# Patient Record
Sex: Female | Born: 1960 | Race: White | Hispanic: No | Smoking: Never smoker
Health system: Southern US, Community
[De-identification: ages and names within clinical notes are randomized; demographics above are authoritative.]

## PROBLEM LIST (undated history)

## (undated) DIAGNOSIS — E785 Hyperlipidemia, unspecified: Secondary | ICD-10-CM

## (undated) HISTORY — PX: CARPAL TUNNEL RELEASE: SHX101

## (undated) HISTORY — PX: TOTAL HIP ARTHROPLASTY: SHX124

## (undated) HISTORY — DX: Hyperlipidemia, unspecified: E78.5

## (undated) HISTORY — PX: REPLACEMENT TOTAL KNEE: SUR1224

---

## 2010-08-03 ENCOUNTER — Ambulatory Visit: Payer: Self-pay | Admitting: Internal Medicine

## 2011-08-04 ENCOUNTER — Ambulatory Visit: Payer: Self-pay | Admitting: Internal Medicine

## 2012-08-08 ENCOUNTER — Ambulatory Visit: Payer: Self-pay | Admitting: Internal Medicine

## 2013-08-01 ENCOUNTER — Ambulatory Visit: Payer: Self-pay | Admitting: Internal Medicine

## 2014-08-12 ENCOUNTER — Ambulatory Visit: Payer: Self-pay | Admitting: Internal Medicine

## 2015-08-20 ENCOUNTER — Other Ambulatory Visit: Payer: Self-pay | Admitting: Internal Medicine

## 2015-08-20 DIAGNOSIS — Z1231 Encounter for screening mammogram for malignant neoplasm of breast: Secondary | ICD-10-CM

## 2015-08-25 ENCOUNTER — Ambulatory Visit
Admission: RE | Admit: 2015-08-25 | Discharge: 2015-08-25 | Disposition: A | Discharge status: Home / Self Care | Payer: BLUE CROSS/BLUE SHIELD | Source: Ambulatory Visit | Attending: Internal Medicine | Admitting: Internal Medicine

## 2015-08-25 DIAGNOSIS — Z1231 Encounter for screening mammogram for malignant neoplasm of breast: Secondary | ICD-10-CM | POA: Diagnosis not present

## 2016-08-16 ENCOUNTER — Other Ambulatory Visit: Payer: Self-pay | Admitting: Internal Medicine

## 2016-08-16 DIAGNOSIS — Z1231 Encounter for screening mammogram for malignant neoplasm of breast: Secondary | ICD-10-CM

## 2016-09-01 ENCOUNTER — Ambulatory Visit
Admission: RE | Admit: 2016-09-01 | Discharge: 2016-09-01 | Disposition: A | Discharge status: Home / Self Care | Payer: BLUE CROSS/BLUE SHIELD | Source: Ambulatory Visit | Attending: Internal Medicine | Admitting: Internal Medicine

## 2016-09-01 DIAGNOSIS — Z1231 Encounter for screening mammogram for malignant neoplasm of breast: Secondary | ICD-10-CM | POA: Insufficient documentation

## 2016-09-09 ENCOUNTER — Other Ambulatory Visit: Payer: Self-pay | Admitting: Internal Medicine

## 2016-09-09 DIAGNOSIS — N6489 Other specified disorders of breast: Secondary | ICD-10-CM

## 2018-03-21 ENCOUNTER — Other Ambulatory Visit: Payer: Self-pay | Admitting: Sports Medicine

## 2018-03-21 ENCOUNTER — Encounter: Payer: Self-pay | Admitting: Sports Medicine

## 2018-03-21 ENCOUNTER — Telehealth: Payer: Self-pay | Admitting: *Deleted

## 2018-03-21 ENCOUNTER — Ambulatory Visit: Payer: BLUE CROSS/BLUE SHIELD | Admitting: Sports Medicine

## 2018-03-21 ENCOUNTER — Ambulatory Visit (INDEPENDENT_AMBULATORY_CARE_PROVIDER_SITE_OTHER): Payer: BLUE CROSS/BLUE SHIELD

## 2018-03-21 DIAGNOSIS — S96911A Strain of unspecified muscle and tendon at ankle and foot level, right foot, initial encounter: Secondary | ICD-10-CM

## 2018-03-21 DIAGNOSIS — S99911A Unspecified injury of right ankle, initial encounter: Secondary | ICD-10-CM

## 2018-03-21 DIAGNOSIS — M792 Neuralgia and neuritis, unspecified: Secondary | ICD-10-CM

## 2018-03-21 DIAGNOSIS — S93401A Sprain of unspecified ligament of right ankle, initial encounter: Secondary | ICD-10-CM

## 2018-03-21 NOTE — Telephone Encounter (Signed)
-----   Message from [place], [place] sent at 03/21/2018 11:23 AM EDT ----- Regarding: MRI R ankle Eval peroneal tendon possible tear with pain along tendon and  tingling along sural nerve course after misstep from curve 2 weeks ago

## 2018-03-21 NOTE — Progress Notes (Signed)
Subjective: Kelly Snow is a 57 y.o. female patient who presents to office for evaluation of right foot pain. Patient complains of progressive pain especially over the last 2 weeks that the side of the right foot and ankle. Ranks pain 5/10 and is now interferring with daily activities especially with certain motions of turning foot side to side can feel a burning and stretching pain that radiates on the lateral side of her foot and ankle.  States that this started after she took a misstep off a curb 2 weeks ago. Patient has tried rest, ice, elevation and taking Tylenol which seems like it has helped some however still concerned because pain is still present after 2 weeks. Patient denies any other pedal complaints.   Review of Systems  Musculoskeletal: Positive for joint pain.  All other systems reviewed and are negative.    There are no active problems to display for this patient.   No current outpatient medications on file prior to visit.   No current facility-administered medications on file prior to visit.     Not on File  Objective:  General: Alert and oriented x3 in no acute distress  Dermatology: No open lesions bilateral lower extremities, no webspace macerations, no ecchymosis bilateral, all nails x 10 are well manicured.  Vascular: Dorsalis Pedis and Posterior Tibial pedal pulses palpable, Capillary Fill Time 3 seconds,(+) pedal hair growth bilateral, very minimal swelling at right lateral foot and ankle, Temperature gradient within normal limits.  Neurology: Gross sensation intact via light touch bilateral, negative Tinel sign on the right.  Subjective burning sensation with extreme range of motion especially with inversion on the right lateral ankle over the sural nerve.  Musculoskeletal: Mild tenderness with palpation peroneal tendon course on the right, no frank instability noted however there is pain with end range inversion on the right lateral foot and ankle with a  pulling and burning sensation.    Gait: Mildly antalgic gait  Xrays  Right foot   Impression: Diffuse arthritis, no obvious fracture, mild soft tissue swelling, no other acute findings.  Assessment and Plan: Problem List Items Addressed This Visit    None    Visit Diagnoses    Ankle injury, right, initial encounter    -  Primary   Neuritis       Tear of tendon of right foot, initial encounter       peroneal   Mild ankle sprain, right, initial encounter          -Complete examination performed -Xrays reviewed -Discussed treatement options for possible partial tendon tear of peroneal tendons versus ankle sprain with neuritis -Patient declined cam boot and declined soft cast -Dispensed ankle gauntlet to use soft protection to right foot and ankle meanwhile -Prescribed MRI for further evaluation of ligaments and tendons at the lateral ankle to rule out possible tear -Recommend rest, ice, compression with compression garments that she already has an elevation until symptoms improve -May continue with Tylenol as needed for pain and patient is currently on meloxicam for arthritis that she has in other areas -Patient to return to office after MRI or sooner if condition worsens.  Asencion Islam, DPM

## 2018-03-28 NOTE — Telephone Encounter (Signed)
I informed pt of MRI appt and she stated she may not be able to make the time, I gave pt [facility] [phone] to schedule.

## 2018-03-28 NOTE — Telephone Encounter (Signed)
[facility] scheduled pt for MRI [id number] right on 03/30/2018 arrive 9:30am for 9:45am. [facility].

## 2023-10-19 ENCOUNTER — Other Ambulatory Visit: Payer: Self-pay | Admitting: Pharmacist

## 2023-10-19 ENCOUNTER — Other Ambulatory Visit: Payer: Self-pay | Admitting: Urgent Care

## 2023-10-19 DIAGNOSIS — R5381 Other malaise: Secondary | ICD-10-CM

## 2023-10-19 DIAGNOSIS — Z136 Encounter for screening for cardiovascular disorders: Secondary | ICD-10-CM

## 2023-10-20 ENCOUNTER — Other Ambulatory Visit: Payer: Self-pay | Admitting: Urgent Care

## 2023-10-20 DIAGNOSIS — R5381 Other malaise: Secondary | ICD-10-CM

## 2023-10-24 ENCOUNTER — Other Ambulatory Visit: Payer: Self-pay | Admitting: Urgent Care

## 2023-10-24 DIAGNOSIS — Z13820 Encounter for screening for osteoporosis: Secondary | ICD-10-CM

## 2023-11-28 ENCOUNTER — Ambulatory Visit
Admission: RE | Admit: 2023-11-28 | Discharge: 2023-11-28 | Disposition: A | Discharge status: Home / Self Care | Payer: No Typology Code available for payment source | Source: Ambulatory Visit | Attending: Urgent Care | Admitting: Urgent Care

## 2023-11-28 DIAGNOSIS — Z136 Encounter for screening for cardiovascular disorders: Secondary | ICD-10-CM

## 2024-01-31 ENCOUNTER — Other Ambulatory Visit: Payer: Self-pay | Admitting: Urgent Care

## 2024-01-31 ENCOUNTER — Ambulatory Visit
Admission: RE | Admit: 2024-01-31 | Discharge: 2024-01-31 | Disposition: A | Discharge status: Home / Self Care | Payer: PRIVATE HEALTH INSURANCE | Source: Ambulatory Visit | Attending: Urgent Care | Admitting: Urgent Care

## 2024-01-31 DIAGNOSIS — M25511 Pain in right shoulder: Secondary | ICD-10-CM

## 2024-05-24 LAB — LAB REPORT - SCANNED
A1c: 6
Creatinine, POC: 35 mg/dL
EGFR: 101

## 2024-06-04 ENCOUNTER — Other Ambulatory Visit: Payer: Self-pay

## 2024-06-04 DIAGNOSIS — E785 Hyperlipidemia, unspecified: Secondary | ICD-10-CM | POA: Insufficient documentation

## 2024-06-05 ENCOUNTER — Encounter: Payer: Self-pay | Admitting: Cardiology

## 2024-06-05 ENCOUNTER — Ambulatory Visit: Payer: PRIVATE HEALTH INSURANCE | Attending: Cardiology | Admitting: Cardiology

## 2024-06-05 VITALS — BP 134/82 | HR 62 | Ht 65.0 in | Wt 270.2 lb

## 2024-06-05 DIAGNOSIS — R011 Cardiac murmur, unspecified: Secondary | ICD-10-CM

## 2024-06-05 DIAGNOSIS — I2510 Atherosclerotic heart disease of native coronary artery without angina pectoris: Secondary | ICD-10-CM | POA: Insufficient documentation

## 2024-06-05 DIAGNOSIS — E785 Hyperlipidemia, unspecified: Secondary | ICD-10-CM | POA: Diagnosis not present

## 2024-06-05 DIAGNOSIS — I10 Essential (primary) hypertension: Secondary | ICD-10-CM

## 2024-06-05 DIAGNOSIS — R0609 Other forms of dyspnea: Secondary | ICD-10-CM

## 2024-06-05 HISTORY — DX: Cardiac murmur, unspecified: R01.1

## 2024-06-05 HISTORY — DX: Essential (primary) hypertension: I10

## 2024-06-05 HISTORY — DX: Atherosclerotic heart disease of native coronary artery without angina pectoris: I25.10

## 2024-06-05 HISTORY — DX: Other forms of dyspnea: R06.09

## 2024-06-05 HISTORY — DX: Morbid (severe) obesity due to excess calories: E66.01

## 2024-06-05 MED ORDER — METOPROLOL TARTRATE 25 MG PO TABS: ORAL_TABLET | ORAL | 0 refills | Status: DC

## 2024-06-05 MED ORDER — ASPIRIN 81 MG PO TBEC: 81.0000 mg | DELAYED_RELEASE_TABLET | Freq: Every day | ORAL | Status: DC

## 2024-06-05 MED ORDER — METOPROLOL TARTRATE 50 MG PO TABS: ORAL_TABLET | ORAL | 0 refills | Status: DC

## 2024-06-05 NOTE — Patient Instructions (Addendum)
 Medication Instructions:  Your physician has recommended you make the following change in your medication:   Start taking 81 mg coated aspirin  daily  *If you need a refill on your cardiac medications before your next appointment, please call your pharmacy*   Lab Work: Your physician recommends that you return for lab work in: 6 weeks for lft's and fasting lipids You need to have labs done when you are fasting.  You can come Monday through Friday 8:30 am to 12:00 pm and 1:15 to 4:30. You do not need to make an appointment as the order has already been placed. The labs you are going to have done are BMET, CBC, TSH, LFT and Lipids.  If you have labs (blood work) drawn today and your tests are completely normal, you will receive your results only by: MyChart Message (if you have MyChart) OR A paper copy in the mail If you have any lab test that is abnormal or we need to change your treatment, we will call you to review the results.  Testing/Procedures: Your physician has requested that you have an echocardiogram. Echocardiography is a painless test that uses sound waves to create images of your heart. It provides your doctor with information about the size and shape of your heart and how well your heart's chambers and valves are working. This procedure takes approximately one hour. There are no restrictions for this procedure. Please do NOT wear cologne, perfume, aftershave, or lotions (deodorant is allowed). Please arrive 15 minutes prior to your appointment time.  Please note: We ask at that you not bring children with you during ultrasound (echo/ vascular) testing. Due to room size and safety concerns, children are not allowed in the ultrasound rooms during exams. Our front office staff cannot provide observation of children in our lobby area while testing is being conducted. An adult accompanying a patient to their appointment will only be allowed in the ultrasound room at the discretion of the  ultrasound technician under special circumstances. We apologize for any inconvenience.    Your cardiac CT will be scheduled at one of the below locations:   [facility] [address] [place], [place]  Please follow these instructions carefully (unless otherwise directed):  An IV will be required for this test and Nitroglycerin will be given.   On the Night Before the Test: Be sure to Drink plenty of water. Do not consume any caffeinated/decaffeinated beverages or chocolate 12 hours prior to your test. Do not take any antihistamines 12 hours prior to your test.  On the Day of the Test: Drink plenty of water until 1 hour prior to the test. Do not eat any food 1 hour prior to test. You may take your regular medications prior to the test.  Take metoprolol  (Lopressor ) 25 mg two hours prior to test. FEMALES- please wear underwire-free bra if available, avoid dresses & tight clothing      After the Test: Drink plenty of water. After receiving IV contrast, you may experience a mild flushed feeling. This is normal. On occasion, you may experience a mild rash up to 24 hours after the test. This is not dangerous. If this occurs, you can take Benadryl 25 mg, Zyrtec, Claritin, or Allegra and increase your fluid intake. (Patients taking Tikosyn should avoid Benadryl, and may take Zyrtec, Claritin, or Allegra) If you experience trouble breathing, this can be serious. If it is severe call 911 IMMEDIATELY. If it is mild, please call our office.  We will call to  schedule your test 2-4 weeks out understanding that some insurance companies will need an authorization prior to the service being performed.   For more information and frequently asked questions, please visit our website : [email or web address]  For non-scheduling related questions, please contact the cardiac imaging nurse navigator should you have any questions/concerns: Cardiac Imaging Nurse Navigators Direct Office  Dial: [phone]   For scheduling needs, including cancellations and rescheduling, please call [place], [phone].   Follow-Up: At [facility], you and your health needs are our priority.  As part of our continuing mission to provide you with exceptional heart care, we have created designated Provider Care Teams.  These Care Teams include your primary Cardiologist (physician) and Advanced Practice Providers (APPs -  Physician Assistants and Nurse Practitioners) who all work together to provide you with the care you need, when you need it.  We recommend signing up for the patient portal called MyChart.  Sign up information is provided on this After Visit Summary.  MyChart is used to connect with patients for Virtual Visits (Telemedicine).  Patients are able to view lab/test results, encounter notes, upcoming appointments, etc.  Non-urgent messages can be sent to your provider as well.   To learn more about what you can do with MyChart, go to [email or web address].    Your next appointment:   2 month(s)  The format for your next appointment:   In Person  Provider:   Jennifer Crape, MD   Other Instructions Echocardiogram An echocardiogram is a test that uses sound waves (ultrasound) to produce images of the heart. Images from an echocardiogram can provide important information about: Heart size and shape. The size and thickness and movement of your heart's walls. Heart muscle function and strength. Heart valve function or if you have stenosis. Stenosis is when the heart valves are too narrow. If blood is flowing backward through the heart valves (regurgitation). A tumor or infectious growth around the heart valves. Areas of heart muscle that are not working well because of poor blood flow or injury from a heart attack. Aneurysm detection. An aneurysm is a weak or damaged part of an artery wall. The wall bulges out from the normal force of blood pumping through the body. Tell  a health care provider about: Any allergies you have. All medicines you are taking, including vitamins, herbs, eye drops, creams, and over-the-counter medicines. Any blood disorders you have. Any surgeries you have had. Any medical conditions you have. Whether you are pregnant or may be pregnant. What are the risks? Generally, this is a safe test. However, problems may occur, including an allergic reaction to dye (contrast) that may be used during the test. What happens before the test? No specific preparation is needed. You may eat and drink normally. What happens during the test? You will take off your clothes from the waist up and put on a hospital gown. Electrodes or electrocardiogram (ECG)patches may be placed on your chest. The electrodes or patches are then connected to a device that monitors your heart rate and rhythm. You will lie down on a table for an ultrasound exam. A gel will be applied to your chest to help sound waves pass through your skin. A handheld device, called a transducer, will be pressed against your chest and moved over your heart. The transducer produces sound waves that travel to your heart and bounce back (or echo back) to the transducer. These sound waves will be captured in real-time and changed into images of your heart that  can be viewed on a video monitor. The images will be recorded on a computer and reviewed by your health care provider. You may be asked to change positions or hold your breath for a short time. This makes it easier to get different views or better views of your heart. In some cases, you may receive contrast through an IV in one of your veins. This can improve the quality of the pictures from your heart. The procedure may vary among health care providers and hospitals.   What can I expect after the test? You may return to your normal, everyday life, including diet, activities, and medicines, unless your health care provider tells you not to do  that. Follow these instructions at home: It is up to you to get the results of your test. Ask your health care provider, or the department that is doing the test, when your results will be ready. Keep all follow-up visits. This is important. Summary An echocardiogram is a test that uses sound waves (ultrasound) to produce images of the heart. Images from an echocardiogram can provide important information about the size and shape of your heart, heart muscle function, heart valve function, and other possible heart problems. You do not need to do anything to prepare before this test. You may eat and drink normally. After the echocardiogram is completed, you may return to your normal, everyday life, unless your health care provider tells you not to do that. This information is not intended to replace advice given to you by your health care provider. Make sure you discuss any questions you have with your health care provider. Document Revised: 06/02/2020 Document Reviewed: 06/02/2020 Elsevier Patient Education  2021 Elsevier Inc.   Important Information About Sugar

## 2024-06-05 NOTE — Progress Notes (Signed)
 Cardiology Office Note:    Date:  06/05/2024   ID:  Kelly Snow, DOB [birth year], MRN [id number]  PCP:  Thurmond Cathlyn LABOR., MD  Cardiologist:  Jennifer JONELLE Crape, MD   Referring MD: Waddell Rake, MD    ASSESSMENT:    1. Hyperlipidemia, unspecified hyperlipidemia type   2. Coronary artery disease involving native coronary artery of native heart without angina pectoris   3. Morbid obesity (HCC)   4. Essential hypertension   5. DOE (dyspnea on exertion)   6. Cardiac murmur    PLAN:    In order of problems listed above:  Dyspnea on exertion: Markedly elevated calcium score and coronary artery calcification: I discussed my findings with the patient at extensive length.  I would like to evaluate her for any ischemic substrate and I discussed CT coronary angiography with FFR and she is agreeable.  She was advised to take a coated baby aspirin  on a daily basis. Essential hypertension: Blood pressure stable and diet was emphasized. Mixed dyslipidemia: On lipid-lowering medications.  Lipids are markedly elevated her statin was stopped she will be back in a month for follow-up blood work.  Goal LDL less than 60.  Diet emphasized lifestyle modification urged. Morbid obesity: Weight reduction stressed risks of obesity explained and she promises to do better. Patient will be seen in follow-up appointment in 6 months or earlier if the patient has any concerns.    Medication Adjustments/Labs and Tests Ordered: Current medicines are reviewed at length with the patient today.  Concerns regarding medicines are outlined above.  Orders Placed This Encounter  Procedures   EKG 12-Lead   No orders of the defined types were placed in this encounter.    History of Present Illness:    Kelly Snow is a 63 y.o. female who is being seen today for the evaluation of dyspnea on exertion at the request of Waddell Rake, MD. patient is a pleasant 63 year old female.  She has past medical history of  recently diagnosed markedly elevated calcium score and coronary artery calcification, essential hypertension, mixed dyslipidemia and morbid obesity.  She leads a sedentary lifestyle.  She has dyspnea on exertion.  She denies any chest pain orthopnea or PND.  At the time of my evaluation, the patient is alert awake oriented and in no distress.  Past Medical History:  Diagnosis Date   Hyperlipidemia     Past Surgical History:  Procedure Laterality Date   CARPAL TUNNEL RELEASE     REPLACEMENT TOTAL KNEE Bilateral    TOTAL HIP ARTHROPLASTY Right     Current Medications: Current Meds  Medication Sig   amLODipine (NORVASC) 5 MG tablet Take 5 mg by mouth daily.   APPLE CIDER VINEGAR PO Take 2 each by mouth daily.   atenolol (TENORMIN) 25 MG tablet Take 25 mg by mouth daily.   Calcium Carb-Cholecalciferol (CALCIUM 600 + D PO) Take 2 capsules by mouth daily.   FIBER GUMMIES PO Take 3 each by mouth daily.   lisinopril (ZESTRIL) 40 MG tablet Take 40 mg by mouth every morning.   MAGNESIUM PO Take 1 tablet by mouth daily.   meloxicam (MOBIC) 7.5 MG tablet Take 7.5 mg by mouth daily.   Multiple Vitamin (MULTIVITAMIN) tablet Take 1 tablet by mouth daily.   Polyethylene Glycol 3350 (MIRALAX PO) Take 1 Capful by mouth daily.   rosuvastatin (CRESTOR) 20 MG tablet Take 20 mg by mouth daily.   vitamin B-12 (CYANOCOBALAMIN) 500 MCG tablet Take 1,000 mcg  by mouth once a week.     Allergies:   Patient has no known allergies.   Social History   Socioeconomic History   Marital status: Widowed    Spouse name: Not on file   Number of children: Not on file   Years of education: Not on file   Highest education level: Not on file  Occupational History   Not on file  Tobacco Use   Smoking status: Never   Smokeless tobacco: Never  Substance and Sexual Activity   Alcohol use: Not on file   Drug use: Not on file   Sexual activity: Not on file  Other Topics Concern   Not on file  Social History  Narrative   Not on file   Social Drivers of Health   Financial Resource Strain: Not on file  Food Insecurity: Not on file  Transportation Needs: Not on file  Physical Activity: Not on file  Stress: Not on file  Social Connections: Not on file     Family History: The patient's family history includes Cancer in her mother; Diabetes in her father; Heart failure in her father; Hyperlipidemia in her father; Hypertension in her father.  ROS:   Please see the history of present illness.    All other systems reviewed and are negative.  EKGs/Labs/Other Studies Reviewed:    The following studies were reviewed today:  EKG Interpretation Date/Time:  Wednesday June 05 2024 15:36:46 EDT Ventricular Rate:  62 PR Interval:  172 QRS Duration:  98 QT Interval:  402 QTC Calculation: 408 R Axis:   74  Text Interpretation: Normal sinus rhythm Normal ECG No previous ECGs available Confirmed by Edwyna Backers [phone]) on 06/05/2024 3:58:41 PM     Recent Labs: No results found for requested labs within last 365 days.  Recent Lipid Panel No results found for: CHOL, TRIG, HDL, CHOLHDL, VLDL, LDLCALC, LDLDIRECT  Physical Exam:    VS:  BP 134/82   Pulse 62   Ht 5' 5 (1.651 m)   Wt 270 lb 3.2 oz (122.6 kg)   SpO2 97%   BMI 44.96 kg/m     Wt Readings from Last 3 Encounters:  06/05/24 270 lb 3.2 oz (122.6 kg)     GEN: Patient is in no acute distress HEENT: Normal NECK: No JVD; No carotid bruits LYMPHATICS: No lymphadenopathy CARDIAC: S1 S2 regular, 2/6 systolic murmur at the apex. RESPIRATORY:  Clear to auscultation without rales, wheezing or rhonchi  ABDOMEN: Soft, non-tender, non-distended MUSCULOSKELETAL:  No edema; No deformity  SKIN: Warm and dry NEUROLOGIC:  Alert and oriented x 3 PSYCHIATRIC:  Normal affect    Signed, Backers JONELLE Edwyna, MD  06/05/2024 4:28 PM    [facility]

## 2024-06-11 ENCOUNTER — Ambulatory Visit (HOSPITAL_BASED_OUTPATIENT_CLINIC_OR_DEPARTMENT_OTHER): Admission: RE | Admit: 2024-06-11 | Payer: PRIVATE HEALTH INSURANCE | Source: Ambulatory Visit

## 2024-06-13 ENCOUNTER — Telehealth: Payer: Self-pay | Admitting: Cardiology

## 2024-06-13 NOTE — Telephone Encounter (Signed)
 Patient says she already had labs on 7/30 with a Dr. Waddell and she would like to know why Dr. Edwyna ordered additional labs. She says she doesn't know which panels were drawn, but assumes she had everything done because it was her first visit with that doctor. Please advise.

## 2024-06-13 NOTE — Telephone Encounter (Signed)
 Advised patient that the labs are for 6 weeks. Pt states that [facility] sent her an email stating she needed labs. Pt verbalized understanding and had no additional questions.

## 2024-06-14 ENCOUNTER — Other Ambulatory Visit: Payer: BLUE CROSS/BLUE SHIELD

## 2024-06-25 ENCOUNTER — Telehealth: Payer: Self-pay | Admitting: Cardiology

## 2024-06-25 NOTE — Telephone Encounter (Signed)
  Patient is calling to get CPT code for the patient's CT morph for her insurance

## 2024-06-25 NOTE — Telephone Encounter (Signed)
 CPT code provided to pt as requested. Pt had no additional questions.

## 2024-06-27 ENCOUNTER — Ambulatory Visit: Payer: PRIVATE HEALTH INSURANCE | Attending: Cardiology

## 2024-06-27 DIAGNOSIS — R0609 Other forms of dyspnea: Secondary | ICD-10-CM

## 2024-06-27 DIAGNOSIS — R011 Cardiac murmur, unspecified: Secondary | ICD-10-CM | POA: Diagnosis not present

## 2024-06-27 LAB — ECHOCARDIOGRAM COMPLETE
AR max vel: 1.1 cm2
AV Area VTI: 1.14 cm2
AV Area mean vel: 1.1 cm2
AV Mean grad: 7.8 mmHg
AV Peak grad: 14.2 mmHg
Ao pk vel: 1.89 m/s
Area-P 1/2: 3.46 cm2
MV M vel: 2.6 m/s
MV Peak grad: 27 mmHg
MV VTI: 0.82 cm2
S' Lateral: 3.6 cm

## 2024-06-29 ENCOUNTER — Ambulatory Visit: Payer: Self-pay | Admitting: Cardiology

## 2024-07-08 ENCOUNTER — Telehealth: Payer: Self-pay | Admitting: Cardiology

## 2024-07-08 NOTE — Telephone Encounter (Signed)
 Called the patient and informed her that a copy of the Cardiac Ct order had been faxed to Dorothyann Lauth at [facility]. Patient verbalized understanding and had no further questions at this time.

## 2024-07-08 NOTE — Telephone Encounter (Signed)
 Patient wants a copy of her CT Scan order sent to Dorothyann Lauth at [facility], fax# [phone].

## 2024-07-16 ENCOUNTER — Telehealth: Payer: Self-pay | Admitting: Cardiology

## 2024-07-16 NOTE — Telephone Encounter (Signed)
 Kelly Snow with [facility] called in stating she received order for CTA however she would like pt last OV note faxed over to her prior to scheduling  Fax: [phone]

## 2024-07-16 NOTE — Telephone Encounter (Signed)
 Last office note faxed to [facility] at [phone].

## 2024-07-18 NOTE — Telephone Encounter (Signed)
 Catherine from [facility] requesting a c/b in regards to the order that were sent over.

## 2024-07-19 NOTE — Telephone Encounter (Signed)
 Left message for Kelly Snow from [facility] to call back.

## 2024-07-19 NOTE — Telephone Encounter (Signed)
 Pt calling to give nurse Richard Catherine's number [phone]. Please Advise

## 2024-07-20 LAB — COMPREHENSIVE METABOLIC PANEL WITH GFR
ALT: 11 IU/L (ref 0–32)
AST: 18 IU/L (ref 0–40)
Albumin: 4.4 g/dL (ref 3.8–4.9)
Alkaline Phosphatase: 88 IU/L (ref 49–135)
BUN/Creatinine Ratio: 17 (ref 12–28)
BUN: 13 mg/dL (ref 8–27)
Bilirubin Total: 0.5 mg/dL (ref 0.0–1.2)
CO2: 22 mmol/L (ref 20–29)
Calcium: 9.8 mg/dL (ref 8.7–10.3)
Chloride: 104 mmol/L (ref 96–106)
Creatinine, Ser: 0.75 mg/dL (ref 0.57–1.00)
Globulin, Total: 2.7 g/dL (ref 1.5–4.5)
Glucose: 113 mg/dL — ABNORMAL HIGH (ref 70–99)
Potassium: 4.4 mmol/L (ref 3.5–5.2)
Sodium: 144 mmol/L (ref 134–144)
Total Protein: 7.1 g/dL (ref 6.0–8.5)
eGFR: 91 mL/min/1.73 (ref >59)

## 2024-07-20 LAB — LIPID PANEL
Chol/HDL Ratio: 4.5 ratio — ABNORMAL HIGH (ref 0.0–4.4)
Cholesterol, Total: 139 mg/dL (ref 100–199)
HDL: 31 mg/dL — ABNORMAL LOW (ref >39)
LDL Chol Calc (NIH): 82 mg/dL (ref 0–99)
Triglycerides: 148 mg/dL (ref 0–149)
VLDL Cholesterol Cal: 26 mg/dL (ref 5–40)

## 2024-07-22 NOTE — Telephone Encounter (Signed)
 Kelly Snow calling stating the orders multiple requests were sent ; CTA coronary morphe with CTA cor with score with CA with CM and/or without CM   She wants to confirm pt needs a CTA coronary morph and a Ct calcium score. Or one or the other. Please advise   She states you dont have to fax it over again, you can call her and her vm is confidential.

## 2024-07-22 NOTE — Telephone Encounter (Signed)
 Advised that pt is to have a cardiac CT with contrast. Kelly Snow verbalized understanding and had no additional questions.

## 2024-08-26 ENCOUNTER — Other Ambulatory Visit: Payer: Self-pay

## 2024-08-28 ENCOUNTER — Ambulatory Visit: Payer: PRIVATE HEALTH INSURANCE | Attending: Cardiology | Admitting: Cardiology

## 2024-08-28 ENCOUNTER — Encounter: Payer: Self-pay | Admitting: Cardiology

## 2024-08-28 VITALS — BP 146/84 | HR 53 | Ht 65.0 in | Wt 249.4 lb

## 2024-08-28 DIAGNOSIS — R0609 Other forms of dyspnea: Secondary | ICD-10-CM

## 2024-08-28 DIAGNOSIS — I2510 Atherosclerotic heart disease of native coronary artery without angina pectoris: Secondary | ICD-10-CM

## 2024-08-28 DIAGNOSIS — I10 Essential (primary) hypertension: Secondary | ICD-10-CM | POA: Diagnosis not present

## 2024-08-28 DIAGNOSIS — E782 Mixed hyperlipidemia: Secondary | ICD-10-CM | POA: Diagnosis not present

## 2024-08-28 MED ORDER — NITROGLYCERIN 0.4 MG SL SUBL: 0.4000 mg | SUBLINGUAL_TABLET | SUBLINGUAL | 6 refills | Status: AC | PRN

## 2024-08-28 MED ORDER — ASPIRIN 81 MG PO TBEC: 81.0000 mg | DELAYED_RELEASE_TABLET | Freq: Every day | ORAL | Status: AC

## 2024-08-28 NOTE — Progress Notes (Signed)
 Cardiology Office Note:    Date:  08/28/2024   ID:  Kelly Snow, DOB 1961/03/02, MRN [id number]  PCP:  Waddell Rake, MD  Cardiologist:  Jennifer JONELLE Crape, MD   Referring MD: Thurmond Cathlyn LABOR., MD    ASSESSMENT:    1. Coronary artery disease involving native coronary artery of native heart without angina pectoris   2. Essential hypertension   3. Mixed hyperlipidemia   4. Morbid obesity (HCC)   5. DOE (dyspnea on exertion)    PLAN:    In order of problems listed above:  Abnormal CT coronary angiography: Dyspnea on exertion: Following recommendations were made to the patient: She was advised to take a coated baby aspirin  on a daily basis.  Sublingual nitroglycerin prescription was sent, its protocol and 911 protocol explained and the patient vocalized understanding questions were answered to the patient's satisfaction.I discussed coronary angiography and left heart catheterization with the patient at extensive length. Procedure, benefits and potential risks were explained. Patient had multiple questions which were answered to the patient's satisfaction. Patient agreed and consented for the procedure. Further recommendations will be made based on the findings of the coronary angiography. In the interim. The patient has any significant symptoms he knows to go to the nearest emergency room. Essential hypertension: Blood pressure stable and diet was emphasized.  Lifestyle modification or salt intake issues discussed Mixed dyslipidemia: On lipid-lowering medications followed by primary care.  Goal LDL less than 60. Morbid obesity: Weight reduction stressed diet emphasized and she promises to do better.  Risks of obesity explained. She be seen for follow-up appointment after the coronary angiography.   Medication Adjustments/Labs and Tests Ordered: Current medicines are reviewed at length with the patient today.  Concerns regarding medicines are outlined above.  Orders Placed This Encounter   Procedures   EKG 12-Lead   No orders of the defined types were placed in this encounter.    No chief complaint on file.    History of Present Illness:    Kelly Snow is a 63 y.o. female.  Patient has past medical history of essential hypertension, dyslipidemia and morbid obesity.  She gives history of dyspnea on exertion.  She underwent CT coronary angiography and this was markedly abnormal with very high calcium score greater than 3000 and significant calcification including in the left main.  Patient is here for follow-up to discuss these findings.  She denies any chest pain orthopnea or PND.  At the time of my evaluation, the patient is alert awake oriented and in no distress.  Overall she leads a sedentary lifestyle.  Past Medical History:  Diagnosis Date   Cardiac murmur 06/05/2024   Coronary artery disease 06/05/2024   DOE (dyspnea on exertion) 06/05/2024   Essential hypertension 06/05/2024   Hyperlipidemia    Morbid obesity (HCC) 06/05/2024    Past Surgical History:  Procedure Laterality Date   CARPAL TUNNEL RELEASE     REPLACEMENT TOTAL KNEE Bilateral    TOTAL HIP ARTHROPLASTY Right     Current Medications: Current Meds  Medication Sig   APPLE CIDER VINEGAR PO Take 2 each by mouth daily.   atenolol (TENORMIN) 25 MG tablet Take 50 mg by mouth daily.   Calcium Carb-Cholecalciferol (CALCIUM 600 + D PO) Take 2 capsules by mouth daily.   FIBER GUMMIES PO Take 3 each by mouth daily.   lisinopril (ZESTRIL) 40 MG tablet Take 40 mg by mouth every morning.   MAGNESIUM PO Take 1 tablet by mouth  daily.   meloxicam (MOBIC) 7.5 MG tablet Take 7.5 mg by mouth daily.   Multiple Vitamin (MULTIVITAMIN) tablet Take 1 tablet by mouth daily.   Polyethylene Glycol 3350 (MIRALAX PO) Take 1 Capful by mouth every 3 (three) days.   rosuvastatin (CRESTOR) 20 MG tablet Take 20 mg by mouth daily.   vitamin B-12 (CYANOCOBALAMIN) 500 MCG tablet Take 1,000 mcg by mouth once a week.      Allergies:   Patient has no known allergies.   Social History   Socioeconomic History   Marital status: Widowed    Spouse name: Not on file   Number of children: Not on file   Years of education: Not on file   Highest education level: Not on file  Occupational History   Not on file  Tobacco Use   Smoking status: Never   Smokeless tobacco: Never  Substance and Sexual Activity   Alcohol use: Not on file   Drug use: Not on file   Sexual activity: Not on file  Other Topics Concern   Not on file  Social History Narrative   Not on file   Social Drivers of Health   Financial Resource Strain: Not on file  Food Insecurity: Not on file  Transportation Needs: Not on file  Physical Activity: Not on file  Stress: Not on file  Social Connections: Not on file     Family History: The patient's family history includes Cancer in her mother; Diabetes in her father; Heart failure in her father; Hyperlipidemia in her father; Hypertension in her father.  ROS:   Please see the history of present illness.    All other systems reviewed and are negative.  EKGs/Labs/Other Studies Reviewed:    The following studies were reviewed today: .SABRAEKG Interpretation Date/Time:  Wednesday August 28 2024 13:48:12 EST Ventricular Rate:  53 PR Interval:  172 QRS Duration:  96 QT Interval:  416 QTC Calculation: 390 R Axis:   80  Text Interpretation: Sinus bradycardia Cannot rule out Anterior infarct , age undetermined Abnormal ECG When compared with ECG of 05-Jun-2024 15:36, No significant change was found Confirmed by Edwyna Backers [id number]) on 08/28/2024 1:57:23 PM     Recent Labs: 07/19/2024: ALT 11; BUN 13; Creatinine, Ser 0.75; Potassium 4.4; Sodium 144  Recent Lipid Panel    Component Value Date/Time   CHOL 139 07/19/2024 0843   TRIG 148 07/19/2024 0843   HDL 31 (L) 07/19/2024 0843   CHOLHDL 4.5 (H) 07/19/2024 0843   LDLCALC 82 07/19/2024 0843    Physical Exam:    VS:  BP (!)  146/84   Pulse (!) 53   Ht 5' 5 (1.651 m)   Wt 249 lb 6.4 oz (113.1 kg)   SpO2 98%   BMI 41.50 kg/m     Wt Readings from Last 3 Encounters:  08/28/24 249 lb 6.4 oz (113.1 kg)  06/05/24 270 lb 3.2 oz (122.6 kg)     GEN: Patient is in no acute distress HEENT: Normal NECK: No JVD; No carotid bruits LYMPHATICS: No lymphadenopathy CARDIAC: Hear sounds regular, 2/6 systolic murmur at the apex. RESPIRATORY:  Clear to auscultation without rales, wheezing or rhonchi  ABDOMEN: Soft, non-tender, non-distended MUSCULOSKELETAL:  No edema; No deformity  SKIN: Warm and dry NEUROLOGIC:  Alert and oriented x 3 PSYCHIATRIC:  Normal affect   Signed, Backers JONELLE Edwyna, MD  08/28/2024 2:07 PM    [facility]

## 2024-08-28 NOTE — H&P (View-Only) (Signed)
 Cardiology Office Note:    Date:  08/28/2024   ID:  RAZIYAH VANVLECK, DOB 1961/03/02, MRN [id number]  PCP:  Waddell Rake, MD  Cardiologist:  Jennifer JONELLE Crape, MD   Referring MD: Thurmond Cathlyn LABOR., MD    ASSESSMENT:    1. Coronary artery disease involving native coronary artery of native heart without angina pectoris   2. Essential hypertension   3. Mixed hyperlipidemia   4. Morbid obesity (HCC)   5. DOE (dyspnea on exertion)    PLAN:    In order of problems listed above:  Abnormal CT coronary angiography: Dyspnea on exertion: Following recommendations were made to the patient: She was advised to take a coated baby aspirin  on a daily basis.  Sublingual nitroglycerin prescription was sent, its protocol and 911 protocol explained and the patient vocalized understanding questions were answered to the patient's satisfaction.I discussed coronary angiography and left heart catheterization with the patient at extensive length. Procedure, benefits and potential risks were explained. Patient had multiple questions which were answered to the patient's satisfaction. Patient agreed and consented for the procedure. Further recommendations will be made based on the findings of the coronary angiography. In the interim. The patient has any significant symptoms he knows to go to the nearest emergency room. Essential hypertension: Blood pressure stable and diet was emphasized.  Lifestyle modification or salt intake issues discussed Mixed dyslipidemia: On lipid-lowering medications followed by primary care.  Goal LDL less than 60. Morbid obesity: Weight reduction stressed diet emphasized and she promises to do better.  Risks of obesity explained. She be seen for follow-up appointment after the coronary angiography.   Medication Adjustments/Labs and Tests Ordered: Current medicines are reviewed at length with the patient today.  Concerns regarding medicines are outlined above.  Orders Placed This Encounter   Procedures   EKG 12-Lead   No orders of the defined types were placed in this encounter.    No chief complaint on file.    History of Present Illness:    Kelly Snow is a 63 y.o. female.  Patient has past medical history of essential hypertension, dyslipidemia and morbid obesity.  She gives history of dyspnea on exertion.  She underwent CT coronary angiography and this was markedly abnormal with very high calcium score greater than 3000 and significant calcification including in the left main.  Patient is here for follow-up to discuss these findings.  She denies any chest pain orthopnea or PND.  At the time of my evaluation, the patient is alert awake oriented and in no distress.  Overall she leads a sedentary lifestyle.  Past Medical History:  Diagnosis Date   Cardiac murmur 06/05/2024   Coronary artery disease 06/05/2024   DOE (dyspnea on exertion) 06/05/2024   Essential hypertension 06/05/2024   Hyperlipidemia    Morbid obesity (HCC) 06/05/2024    Past Surgical History:  Procedure Laterality Date   CARPAL TUNNEL RELEASE     REPLACEMENT TOTAL KNEE Bilateral    TOTAL HIP ARTHROPLASTY Right     Current Medications: Current Meds  Medication Sig   APPLE CIDER VINEGAR PO Take 2 each by mouth daily.   atenolol (TENORMIN) 25 MG tablet Take 50 mg by mouth daily.   Calcium Carb-Cholecalciferol (CALCIUM 600 + D PO) Take 2 capsules by mouth daily.   FIBER GUMMIES PO Take 3 each by mouth daily.   lisinopril (ZESTRIL) 40 MG tablet Take 40 mg by mouth every morning.   MAGNESIUM PO Take 1 tablet by mouth  daily.   meloxicam (MOBIC) 7.5 MG tablet Take 7.5 mg by mouth daily.   Multiple Vitamin (MULTIVITAMIN) tablet Take 1 tablet by mouth daily.   Polyethylene Glycol 3350 (MIRALAX PO) Take 1 Capful by mouth every 3 (three) days.   rosuvastatin (CRESTOR) 20 MG tablet Take 20 mg by mouth daily.   vitamin B-12 (CYANOCOBALAMIN) 500 MCG tablet Take 1,000 mcg by mouth once a week.      Allergies:   Patient has no known allergies.   Social History   Socioeconomic History   Marital status: Widowed    Spouse name: Not on file   Number of children: Not on file   Years of education: Not on file   Highest education level: Not on file  Occupational History   Not on file  Tobacco Use   Smoking status: Never   Smokeless tobacco: Never  Substance and Sexual Activity   Alcohol use: Not on file   Drug use: Not on file   Sexual activity: Not on file  Other Topics Concern   Not on file  Social History Narrative   Not on file   Social Drivers of Health   Financial Resource Strain: Not on file  Food Insecurity: Not on file  Transportation Needs: Not on file  Physical Activity: Not on file  Stress: Not on file  Social Connections: Not on file     Family History: The patient's family history includes Cancer in her mother; Diabetes in her father; Heart failure in her father; Hyperlipidemia in her father; Hypertension in her father.  ROS:   Please see the history of present illness.    All other systems reviewed and are negative.  EKGs/Labs/Other Studies Reviewed:    The following studies were reviewed today: .SABRAEKG Interpretation Date/Time:  Wednesday August 28 2024 13:48:12 EST Ventricular Rate:  53 PR Interval:  172 QRS Duration:  96 QT Interval:  416 QTC Calculation: 390 R Axis:   80  Text Interpretation: Sinus bradycardia Cannot rule out Anterior infarct , age undetermined Abnormal ECG When compared with ECG of 05-Jun-2024 15:36, No significant change was found Confirmed by Edwyna Backers [id number]) on 08/28/2024 1:57:23 PM     Recent Labs: 07/19/2024: ALT 11; BUN 13; Creatinine, Ser 0.75; Potassium 4.4; Sodium 144  Recent Lipid Panel    Component Value Date/Time   CHOL 139 07/19/2024 0843   TRIG 148 07/19/2024 0843   HDL 31 (L) 07/19/2024 0843   CHOLHDL 4.5 (H) 07/19/2024 0843   LDLCALC 82 07/19/2024 0843    Physical Exam:    VS:  BP (!)  146/84   Pulse (!) 53   Ht 5' 5 (1.651 m)   Wt 249 lb 6.4 oz (113.1 kg)   SpO2 98%   BMI 41.50 kg/m     Wt Readings from Last 3 Encounters:  08/28/24 249 lb 6.4 oz (113.1 kg)  06/05/24 270 lb 3.2 oz (122.6 kg)     GEN: Patient is in no acute distress HEENT: Normal NECK: No JVD; No carotid bruits LYMPHATICS: No lymphadenopathy CARDIAC: Hear sounds regular, 2/6 systolic murmur at the apex. RESPIRATORY:  Clear to auscultation without rales, wheezing or rhonchi  ABDOMEN: Soft, non-tender, non-distended MUSCULOSKELETAL:  No edema; No deformity  SKIN: Warm and dry NEUROLOGIC:  Alert and oriented x 3 PSYCHIATRIC:  Normal affect   Signed, Backers JONELLE Edwyna, MD  08/28/2024 2:07 PM    [facility]

## 2024-08-28 NOTE — Patient Instructions (Signed)
 Medication Instructions:  Your physician has recommended you make the following change in your medication:   Take 81 mg coated aspirin  daily.  Use nitroglycerin 1 tablet placed under the tongue at the first sign of chest pain or an angina attack. 1 tablet may be used every 5 minutes as needed, for up to 15 minutes. Do not take more than 3 tablets in 15 minutes. If pain persist call 911 or go to the nearest ED.   *If you need a refill on your cardiac medications before your next appointment, please call your pharmacy*   Lab Work: Your physician recommends that you return for lab work in: week of 09/09/24 for CBC and BMP. You can come Monday through Friday 8:30 am to 12:00 pm and 1:15 to 4:30. You do not need to make an appointment as the order has already been placed.    If you have labs (blood work) drawn today and your tests are completely normal, you will receive your results only by: MyChart Message (if you have MyChart) OR A paper copy in the mail If you have any lab test that is abnormal or we need to change your treatment, we will call you to review the results.   Testing/Procedures:  [place] [facility] [place] [place] [address] [place] [address] Dept: [phone] Loc: [phone]  OFFIE PICKRON  08/28/2024  You are scheduled for a Cardiac Catheterization on Friday, November 28 with Dr. Peter Jordan.  1. Please arrive at the [facility] (Main Entrance A) at [facility]: [address] [place], [place] [address] at 7:00 AM (This time is 2 hour(s) before your procedure to ensure your preparation).   Free valet parking service is available. You will check in at ADMITTING. The support person will be asked to wait in the waiting room.  It is OK to have someone drop you off and come back when you are ready to be discharged.    Special note: Every effort is made to have your procedure done on  time. Please understand that emergencies sometimes delay scheduled procedures.  2. Diet: Nothing to eat after midnight.   3. Hydration: You need to be well hydrated before your procedure. On November 17, you may drink approved liquids (see below) until 2 hours before the procedure, with 16 oz of water as your last intake.   List of approved liquids water, clear juice, clear tea, black coffee, fruit juices, non-citric and without pulp, carbonated beverages, Gatorade, Kool -Aid, plain Jello-O and plain ice popsicles.  4. Labs: You will need to have blood drawn on Monday, November 17 at [facility]: [address] [place] . You do not need to be fasting.  5. Medication instructions in preparation for your procedure:   Contrast Allergy: No    Current Outpatient Medications (Cardiovascular):    atenolol (TENORMIN) 25 MG tablet, Take 50 mg by mouth daily.   lisinopril (ZESTRIL) 40 MG tablet, Take 40 mg by mouth every morning.   rosuvastatin (CRESTOR) 20 MG tablet, Take 20 mg by mouth daily.   Current Outpatient Medications (Analgesics):    meloxicam (MOBIC) 7.5 MG tablet, Take 7.5 mg by mouth daily.  Current Outpatient Medications (Hematological):    vitamin B-12 (CYANOCOBALAMIN) 500 MCG tablet, Take 1,000 mcg by mouth once a week.  Current Outpatient Medications (Other):    APPLE CIDER VINEGAR PO, Take 2 each by mouth daily.  Calcium Carb-Cholecalciferol (CALCIUM 600 + D PO), Take 2 capsules by mouth daily.   FIBER GUMMIES PO, Take 3 each by mouth daily.   MAGNESIUM PO, Take 1 tablet by mouth daily.   Multiple Vitamin (MULTIVITAMIN) tablet, Take 1 tablet by mouth daily.   Polyethylene Glycol 3350 (MIRALAX PO), Take 1 Capful by mouth every 3 (three) days. *For reference purposes while preparing patient instructions.   Delete this med list prior to printing instructions for patient.*  Stop taking, Lisinopril (Zestril or Prinivil) Friday, November 28,, Mobic (Meloxicam) Friday,  November 28,   On the morning of your procedure, take your Aspirin  81 mg and any morning medicines NOT listed above.  You may use sips of water.  6. Plan to go home the same day, you will only stay overnight if medically necessary. 7. Bring a current list of your medications and current insurance cards. 8. You MUST have a responsible person to drive you home. 9. Someone MUST be with you the first 24 hours after you arrive home or your discharge will be delayed. 10. Please wear clothes that are easy to get on and off and wear slip-on shoes.  Thank you for allowing us  to care for you!   -- [facility]    Follow-Up: At [facility], you and your health needs are our priority.  As part of our continuing mission to provide you with exceptional heart care, we have created designated Provider Care Teams.  These Care Teams include your primary Cardiologist (physician) and Advanced Practice Providers (APPs -  Physician Assistants and Nurse Practitioners) who all work together to provide you with the care you need, when you need it.  We recommend signing up for the patient portal called MyChart.  Sign up information is provided on this After Visit Summary.  MyChart is used to connect with patients for Virtual Visits (Telemedicine).  Patients are able to view lab/test results, encounter notes, upcoming appointments, etc.  Non-urgent messages can be sent to your provider as well.   To learn more about what you can do with MyChart, go to [email or web address].    Your next appointment:   4 week(s)  The format for your next appointment:   In Person  Provider:   Jennifer Crape, MD   Other Instructions  Coronary Angiogram With Stent Coronary angiogram with stent placement is a procedure to widen or open a narrow blood vessel of the heart (coronary artery). Arteries may become blocked by cholesterol buildup (plaques) in the lining of the artery wall. When a  coronary artery becomes partially blocked, blood flow to that area decreases. This may lead to chest pain or a heart attack (myocardial infarction). A stent is a small piece of metal that looks like mesh or spring. Stent placement may be done as treatment after a heart attack, or to prevent a heart attack if a blocked artery is found by a coronary angiogram. Let your health care provider know about: Any allergies you have, including allergies to medicines or contrast dye. All medicines you are taking, including vitamins, herbs, eye drops, creams, and over-the-counter medicines. Any problems you or family members have had with anesthetic medicines. Any blood disorders you have. Any surgeries you have had. Any medical conditions you have, including kidney problems or kidney failure. Whether you are pregnant or may be pregnant. Whether you are breastfeeding. What are the risks? Generally, this is a safe procedure. However, serious problems may occur, including: Damage to nearby structures  or organs, such as the heart, blood vessels, or kidneys. A return of blockage. Bleeding, infection, or bruising at the insertion site. A collection of blood under the skin (hematoma) at the insertion site. A blood clot in another part of the body. Allergic reaction to medicines or dyes. Bleeding into the abdomen (retroperitoneal bleeding). Stroke (rare). Heart attack (rare). What happens before the procedure? Staying hydrated Follow instructions from your health care provider about hydration, which may include: Up to 2 hours before the procedure - you may continue to drink clear liquids, such as water, clear fruit juice, black coffee, and plain tea.    Eating and drinking restrictions Follow instructions from your health care provider about eating and drinking, which may include: 8 hours before the procedure - stop eating heavy meals or foods, such as meat, fried foods, or fatty foods. 6 hours before the  procedure - stop eating light meals or foods, such as toast or cereal. 2 hours before the procedure - stop drinking clear liquids. Medicines Ask your health care provider about: Changing or stopping your regular medicines. This is especially important if you are taking diabetes medicines or blood thinners. Taking medicines such as aspirin  and ibuprofen. These medicines can thin your blood. Do not take these medicines unless your health care provider tells you to take them. Generally, aspirin  is recommended before a thin tube, called a catheter, is passed through a blood vessel and inserted into the heart (cardiac catheterization). Taking over-the-counter medicines, vitamins, herbs, and supplements. General instructions Do not use any products that contain nicotine or tobacco for at least 4 weeks before the procedure. These products include cigarettes, e-cigarettes, and chewing tobacco. If you need help quitting, ask your health care provider. Plan to have someone take you home from the hospital or clinic. If you will be going home right after the procedure, plan to have someone with you for 24 hours. You may have tests and imaging procedures. Ask your health care provider: How your insertion site will be marked. Ask which artery will be used for the procedure. What steps will be taken to help prevent infection. These may include: Removing hair at the insertion site. Washing skin with a germ-killing soap. Taking antibiotic medicine. What happens during the procedure? An IV will be inserted into one of your veins. Electrodes may be placed on your chest to monitor your heart rate during the procedure. You will be given one or more of the following: A medicine to help you relax (sedative). A medicine to numb the area (local anesthetic) for catheter insertion. A small incision will be made for catheter insertion. The catheter will be inserted into an artery using a guide wire. The location may  be in your groin, your wrist, or the fold of your arm (near your elbow). An X-ray procedure (fluoroscopy) will be used to help guide the catheter to the opening of the heart arteries. A dye will be injected into the catheter. X-rays will be taken. The dye helps to show where any narrowing or blockages are located in the arteries. Tell your health care provider if you have chest pain or trouble breathing. A tiny wire will be guided to the blocked spot, and a balloon will be inflated to make the artery wider. The stent will be expanded to crush the plaques into the wall of the vessel. The stent will hold the area open and improve the blood flow. Most stents have a drug coating to reduce the risk of the  stent narrowing over time. The artery may be made wider using a drill, laser, or other tools that remove plaques. The catheter will be removed when the blood flow improves. The stent will stay where it was placed, and the lining of the artery will grow over it. A bandage (dressing) will be placed on the insertion site. Pressure will be applied to stop bleeding. The IV will be removed. This procedure may vary among health care providers and hospitals.    What happens after the procedure? Your blood pressure, heart rate, breathing rate, and blood oxygen level will be monitored until you leave the hospital or clinic. If the procedure is done through the leg, you will lie flat in bed for a few hours or for as long as told by your health care provider. You will be instructed not to bend or cross your legs. The insertion site and the pulse in your foot or wrist will be checked often. You may have more blood tests, X-rays, and a test that records the electrical activity of your heart (electrocardiogram, or ECG). Do not drive for 24 hours if you were given a sedative during your procedure. Summary Coronary angiogram with stent placement is a procedure to widen or open a narrowed coronary artery. This is done  to treat heart problems. Before the procedure, let your health care provider know about all the medical conditions and surgeries you have or have had. This is a safe procedure. However, some problems may occur, including damage to nearby structures or organs, bleeding, blood clots, or allergies. Follow your health care provider's instructions about eating, drinking, medicines, and other lifestyle changes, such as quitting tobacco use before the procedure. This information is not intended to replace advice given to you by your health care provider. Make sure you discuss any questions you have with your health care provider. Document Revised: 05/01/2019 Document Reviewed: 05/01/2019 Elsevier Patient Education  2021 Elsevier Inc.  Aspirin  and Your Heart Aspirin  is a medicine that prevents the platelets in your blood from sticking together. Platelets are the cells that your blood uses for clotting. Aspirin  can be used to help reduce the risk of blood clots, heart attacks, and other heart-related problems. What are the risks? Daily use of aspirin  can cause side effects. Some of these include: Bleeding. Bleeding can be minor or serious. An example of minor bleeding is bleeding from a cut, and the bleeding does not stop. An example of more serious bleeding is stomach bleeding or, rarely, bleeding into the brain. Your risk of bleeding increases if you are also taking NSAIDs, such as ibuprofen. Increased bruising. Upset stomach. An allergic reaction. People who have growths inside the nose (nasal polyps) have an increased risk of developing an aspirin  allergy. How to use aspirin  to care for your heart Take aspirin  only as told by your health care provider. Make sure that you understand how much to take and what form to take. The two forms of aspirin  are: Non-enteric-coated.This type of aspirin  does not have a coating and is absorbed quickly. This type of aspirin  also comes in a chewable  form. Enteric-coated. This type of aspirin  has a coating that releases the medicine very slowly. Enteric-coated aspirin  might cause less stomach upset than non-enteric-coated aspirin . This type of aspirin  should not be chewed or crushed. Work with your health care provider to find out whether it is safe and beneficial for you to take aspirin  daily. Taking aspirin  daily may be helpful if: You have had  a heart attack or chest pain, or you are at risk for a heart attack. You have a condition in which certain heart vessels are blocked (coronary artery disease), and you have had a procedure to treat it. Examples are: Open-heart surgery, such as coronary artery bypass surgery (CABG). Coronary angioplasty,which is done to widen a blood vessel of your heart. Having a small mesh tube, or stent, placed in your coronary artery. You have had certain types of stroke or a mini-stroke known as a transient ischemic attack (TIA). You have a narrowing of the arteries that supply the limbs (peripheral artery disease, or PAD). You have long-term (chronic) heart rhythm problems, such as atrial fibrillation, and your health care provider thinks aspirin  may help. You have valve disease or have had surgery on a valve. You are considered at increased risk of developing coronary artery disease or PAD.    Follow these instructions at home Medicines Take over-the-counter and prescription medicines only as told by your health care provider. If you are taking blood thinners: Talk with your health care provider before you take any medicines that contain aspirin  or NSAIDs, such as ibuprofen. These medicines increase your risk for dangerous bleeding. Take your medicine exactly as told, at the same time every day. Avoid activities that could cause injury or bruising, and follow instructions about how to prevent falls. Wear a medical alert bracelet or carry a card that lists what medicines you take. General instructions Do not  drink alcohol if: Your health care provider tells you not to drink. You are pregnant, may be pregnant, or are planning to become pregnant. If you drink alcohol: Limit how much you use to: 0-1 drink a day for women. 0-2 drinks a day for men. Be aware of how much alcohol is in your drink. In the [place] one drink equals one 12 oz bottle of beer (355 mL), one 5 oz glass of wine (148 mL), or one 1 oz glass of hard liquor (44 mL). Keep all follow-up visits as told by your health care provider. This is important. Where to find more information The American Heart Association: www.heart.org Contact a health care provider if you have: Unusual bleeding or bruising. Stomach pain or nausea. Ringing in your ears. An allergic reaction that causes hives, itchy skin, or swelling of the lips, tongue, or face. Get help right away if: You notice that your bowel movements are bloody, or dark red or black in color. You vomit or cough up blood. You have blood in your urine. You cough, breathe loudly (wheeze), or feel short of breath. You have chest pain, especially if the pain spreads to your arms, back, neck, or jaw. You have a headache with confusion. You have any symptoms of a stroke. BE FAST is an easy way to remember the main warning signs of a stroke: B - Balance. Signs are dizziness, sudden trouble walking, or loss of balance. E - Eyes. Signs are trouble seeing or a sudden change in vision. F - Face. Signs are sudden weakness or numbness of the face, or the face or eyelid drooping on one side. A - Arms. Signs are weakness or numbness in an arm. This happens suddenly and usually on one side of the body. S - Speech. Signs are sudden trouble speaking, slurred speech, or trouble understanding what people say. T - Time. Time to call emergency services. Write down what time symptoms started. You have other signs of a stroke, such as: A sudden, severe headache with  no known cause. Nausea or  vomiting. Seizure. These symptoms may represent a serious problem that is an emergency. Do not wait to see if the symptoms will go away. Get medical help right away. Call your local emergency services (911 in the [place] Do not drive yourself to the hospital. Summary Aspirin  use can help reduce the risk of blood clots, heart attacks, and other heart-related problems. Daily use of aspirin  can cause side effects. Take aspirin  only as told by your health care provider. Make sure that you understand how much to take and what form to take. Your health care provider will help you determine whether it is safe and beneficial for you to take aspirin  daily. This information is not intended to replace advice given to you by your health care provider. Make sure you discuss any questions you have with your health care provider. Document Revised: 07/15/2019 Document Reviewed: 07/15/2019 Elsevier Patient Education  2021 Elsevier Inc. Nitroglycerin sublingual tablets What is this medicine? NITROGLYCERIN (nye troe GLI ser in) is a type of vasodilator. It relaxes blood vessels, increasing the blood and oxygen supply to your heart. This medicine is used to relieve chest pain caused by angina. It is also used to prevent chest pain before activities like climbing stairs, going outdoors in cold weather, or sexual activity. This medicine may be used for other purposes; ask your health care provider or pharmacist if you have questions. COMMON BRAND NAME(S): Nitroquick, Nitrostat, Nitrotab What should I tell my health care provider before I take this medicine? They need to know if you have any of these conditions: anemia head injury, recent stroke, or bleeding in the brain liver disease previous heart attack an unusual or allergic reaction to nitroglycerin, other medicines, foods, dyes, or preservatives pregnant or trying to get pregnant breast-feeding How should I use this medicine? Take this medicine by mouth as  needed. Use at the first sign of an angina attack (chest pain or tightness). You can also take this medicine 5 to 10 minutes before an event likely to produce chest pain. Follow the directions exactly as written on the prescription label. Place one tablet under your tongue and let it dissolve. Do not swallow whole. Replace the dose if you accidentally swallow it. It will help if your mouth is not dry. Saliva around the tablet will help it to dissolve more quickly. Do not eat or drink, smoke or chew tobacco while a tablet is dissolving. Sit down when taking this medicine. In an angina attack, you should feel better within 5 minutes after your first dose. You can take a dose every 5 minutes up to a total of 3 doses. If you do not feel better or feel worse after 1 dose, call 9-1-1 at once. Do not take more than 3 doses in 15 minutes. Your health care provider might give you other directions. Follow those directions if he or she does. Do not take your medicine more often than directed. Talk to your health care provider about the use of this medicine in children. Special care may be needed. Overdosage: If you think you have taken too much of this medicine contact a poison control center or emergency room at once. NOTE: This medicine is only for you. Do not share this medicine with others. What if I miss a dose? This does not apply. This medicine is only used as needed. What may interact with this medicine? Do not take this medicine with any of the following medications: certain migraine medicines  like ergotamine and dihydroergotamine (DHE) medicines used to treat erectile dysfunction like sildenafil, tadalafil, and vardenafil riociguat This medicine may also interact with the following medications: alteplase aspirin  heparin medicines for high blood pressure medicines for mental depression other medicines used to treat angina phenothiazines like chlorpromazine, mesoridazine, prochlorperazine,  thioridazine This list may not describe all possible interactions. Give your health care provider a list of all the medicines, herbs, non-prescription drugs, or dietary supplements you use. Also tell them if you smoke, drink alcohol, or use illegal drugs. Some items may interact with your medicine. What should I watch for while using this medicine? Tell your doctor or health care professional if you feel your medicine is no longer working. Keep this medicine with you at all times. Sit or lie down when you take your medicine to prevent falling if you feel dizzy or faint after using it. Try to remain calm. This will help you to feel better faster. If you feel dizzy, take several deep breaths and lie down with your feet propped up, or bend forward with your head resting between your knees. You may get drowsy or dizzy. Do not drive, use machinery, or do anything that needs mental alertness until you know how this drug affects you. Do not stand or sit up quickly, especially if you are an older patient. This reduces the risk of dizzy or fainting spells. Alcohol can make you more drowsy and dizzy. Avoid alcoholic drinks. Do not treat yourself for coughs, colds, or pain while you are taking this medicine without asking your doctor or health care professional for advice. Some ingredients may increase your blood pressure. What side effects may I notice from receiving this medicine? Side effects that you should report to your doctor or health care professional as soon as possible: allergic reactions (skin rash, itching or hives; swelling of the face, lips, or tongue) low blood pressure (dizziness; feeling faint or lightheaded, falls; unusually weak or tired) low red blood cell counts (trouble breathing; feeling faint; lightheaded, falls; unusually weak or tired) Side effects that usually do not require medical attention (report to your doctor or health care professional if they continue or are bothersome): facial  flushing (redness) headache nausea, vomiting This list may not describe all possible side effects. Call your doctor for medical advice about side effects. You may report side effects to FDA at 1-800-FDA-1088. Where should I keep my medicine? Keep out of the reach of children. Store at room temperature between 20 and 25 degrees C (68 and 77 degrees F). Store in retail buyer. Protect from light and moisture. Keep tightly closed. Throw away any unused medicine after the expiration date. NOTE: This sheet is a summary. It may not cover all possible information. If you have questions about this medicine, talk to your doctor, pharmacist, or health care provider.  2021 Elsevier/Gold Standard (2018-07-11 16:46:32)

## 2024-08-28 NOTE — Addendum Note (Signed)
 Addended by: ONEITA BERLINER on: 08/28/2024 02:43 PM   Modules accepted: Orders

## 2024-09-05 ENCOUNTER — Encounter: Payer: Self-pay | Admitting: Cardiology

## 2024-09-12 ENCOUNTER — Ambulatory Visit: Payer: Self-pay | Admitting: Cardiology

## 2024-09-12 LAB — CBC WITH DIFFERENTIAL/PLATELET
Basophils Absolute: 0.1 x10E3/uL (ref 0.0–0.2)
Basos: 1 %
EOS (ABSOLUTE): 0.1 x10E3/uL (ref 0.0–0.4)
Eos: 1 %
Hematocrit: 41.6 % (ref 34.0–46.6)
Hemoglobin: 13 g/dL (ref 11.1–15.9)
Immature Grans (Abs): 0 x10E3/uL (ref 0.0–0.1)
Immature Granulocytes: 0 %
Lymphocytes Absolute: 2.2 x10E3/uL (ref 0.7–3.1)
Lymphs: 35 %
MCH: 29.1 pg (ref 26.6–33.0)
MCHC: 31.3 g/dL — ABNORMAL LOW (ref 31.5–35.7)
MCV: 93 fL (ref 79–97)
Monocytes Absolute: 0.5 x10E3/uL (ref 0.1–0.9)
Monocytes: 8 %
Neutrophils Absolute: 3.5 x10E3/uL (ref 1.4–7.0)
Neutrophils: 55 %
Platelets: 237 x10E3/uL (ref 150–450)
RBC: 4.47 x10E6/uL (ref 3.77–5.28)
RDW: 13.7 % (ref 11.7–15.4)
WBC: 6.4 x10E3/uL (ref 3.4–10.8)

## 2024-09-12 LAB — BASIC METABOLIC PANEL WITH GFR
BUN/Creatinine Ratio: 17 (ref 12–28)
BUN: 11 mg/dL (ref 8–27)
CO2: 23 mmol/L (ref 20–29)
Calcium: 9.2 mg/dL (ref 8.7–10.3)
Chloride: 105 mmol/L (ref 96–106)
Creatinine, Ser: 0.65 mg/dL (ref 0.57–1.00)
Glucose: 107 mg/dL — ABNORMAL HIGH (ref 70–99)
Potassium: 4 mmol/L (ref 3.5–5.2)
Sodium: 146 mmol/L — ABNORMAL HIGH (ref 134–144)
eGFR: 101 mL/min/1.73 (ref >59)

## 2024-09-20 ENCOUNTER — Encounter (HOSPITAL_COMMUNITY)
Admission: RE | Disposition: A | Discharge status: Home / Self Care | Payer: Self-pay | Source: Home / Self Care | Attending: Cardiology

## 2024-09-20 ENCOUNTER — Other Ambulatory Visit: Payer: Self-pay

## 2024-09-20 ENCOUNTER — Ambulatory Visit (HOSPITAL_COMMUNITY)
Admission: RE | Admit: 2024-09-20 | Discharge: 2024-09-20 | Disposition: A | Discharge status: Home / Self Care | Payer: PRIVATE HEALTH INSURANCE | Attending: Cardiology | Admitting: Cardiology

## 2024-09-20 DIAGNOSIS — Z7982 Long term (current) use of aspirin: Secondary | ICD-10-CM | POA: Insufficient documentation

## 2024-09-20 DIAGNOSIS — Z6841 Body Mass Index (BMI) 40.0 and over, adult: Secondary | ICD-10-CM | POA: Insufficient documentation

## 2024-09-20 DIAGNOSIS — R0609 Other forms of dyspnea: Secondary | ICD-10-CM | POA: Insufficient documentation

## 2024-09-20 DIAGNOSIS — I10 Essential (primary) hypertension: Secondary | ICD-10-CM | POA: Insufficient documentation

## 2024-09-20 DIAGNOSIS — Z79899 Other long term (current) drug therapy: Secondary | ICD-10-CM | POA: Insufficient documentation

## 2024-09-20 DIAGNOSIS — E782 Mixed hyperlipidemia: Secondary | ICD-10-CM | POA: Insufficient documentation

## 2024-09-20 DIAGNOSIS — I2510 Atherosclerotic heart disease of native coronary artery without angina pectoris: Secondary | ICD-10-CM | POA: Insufficient documentation

## 2024-09-20 DIAGNOSIS — I2584 Coronary atherosclerosis due to calcified coronary lesion: Secondary | ICD-10-CM | POA: Insufficient documentation

## 2024-09-20 DIAGNOSIS — Z8249 Family history of ischemic heart disease and other diseases of the circulatory system: Secondary | ICD-10-CM | POA: Insufficient documentation

## 2024-09-20 HISTORY — PX: CORONARY ULTRASOUND/IVUS: CATH118244

## 2024-09-20 HISTORY — PX: LEFT HEART CATH AND CORONARY ANGIOGRAPHY: CATH118249

## 2024-09-20 SURGERY — LEFT HEART CATH AND CORONARY ANGIOGRAPHY
Anesthesia: LOCAL

## 2024-09-20 MED ORDER — ATENOLOL 50 MG PO TABS
50.0000 mg | ORAL_TABLET | Freq: Once | ORAL | Status: AC
  Administered 2024-09-20: 50 mg via ORAL

## 2024-09-20 MED ORDER — HEPARIN SODIUM (PORCINE) 1000 UNIT/ML IJ SOLN
INTRAMUSCULAR | Status: DC | PRN
  Administered 2024-09-20: 5500 [IU] via INTRAVENOUS
  Administered 2024-09-20: 5000 [IU] via INTRAVENOUS

## 2024-09-20 MED ORDER — SODIUM CHLORIDE 0.9% FLUSH: 3.0000 mL | INTRAVENOUS | Status: DC | PRN

## 2024-09-20 MED ORDER — HEPARIN SODIUM (PORCINE) 1000 UNIT/ML IJ SOLN
INTRAMUSCULAR | Status: AC
  Filled 2024-09-20: qty 10

## 2024-09-20 MED ORDER — IOHEXOL 350 MG/ML SOLN
INTRAVENOUS | Status: DC | PRN
  Administered 2024-09-20: 100 mL

## 2024-09-20 MED ORDER — FREE WATER: 500.0000 mL | Freq: Once | Status: DC

## 2024-09-20 MED ORDER — LABETALOL HCL 5 MG/ML IV SOLN: 10.0000 mg | INTRAVENOUS | Status: DC | PRN

## 2024-09-20 MED ORDER — MIDAZOLAM HCL (PF) 2 MG/2ML IJ SOLN
INTRAMUSCULAR | Status: DC | PRN
  Administered 2024-09-20: 1 mg via INTRAVENOUS

## 2024-09-20 MED ORDER — LIDOCAINE HCL (PF) 1 % IJ SOLN
INTRAMUSCULAR | Status: AC
  Filled 2024-09-20: qty 30

## 2024-09-20 MED ORDER — LIDOCAINE HCL (PF) 1 % IJ SOLN
INTRAMUSCULAR | Status: DC | PRN
  Administered 2024-09-20: 2 mL via INTRADERMAL

## 2024-09-20 MED ORDER — HYDRALAZINE HCL 20 MG/ML IJ SOLN
10.0000 mg | INTRAMUSCULAR | Status: DC | PRN
  Filled 2024-09-20: qty 1

## 2024-09-20 MED ORDER — ASPIRIN 81 MG PO CHEW
81.0000 mg | CHEWABLE_TABLET | ORAL | Status: AC
  Administered 2024-09-20: 81 mg via ORAL
  Filled 2024-09-20: qty 1

## 2024-09-20 MED ORDER — SODIUM CHLORIDE 0.9% FLUSH: 3.0000 mL | Freq: Two times a day (BID) | INTRAVENOUS | Status: DC

## 2024-09-20 MED ORDER — NITROGLYCERIN 1 MG/10 ML FOR IR/CATH LAB
INTRA_ARTERIAL | Status: AC
  Filled 2024-09-20: qty 10

## 2024-09-20 MED ORDER — MIDAZOLAM HCL 2 MG/2ML IJ SOLN
INTRAMUSCULAR | Status: AC
  Filled 2024-09-20: qty 2

## 2024-09-20 MED ORDER — FENTANYL CITRATE (PF) 100 MCG/2ML IJ SOLN
INTRAMUSCULAR | Status: DC | PRN
  Administered 2024-09-20: 25 ug via INTRAVENOUS

## 2024-09-20 MED ORDER — FENTANYL CITRATE (PF) 100 MCG/2ML IJ SOLN
INTRAMUSCULAR | Status: AC
  Filled 2024-09-20: qty 2

## 2024-09-20 MED ORDER — VERAPAMIL HCL 2.5 MG/ML IV SOLN
INTRAVENOUS | Status: AC
  Filled 2024-09-20: qty 2

## 2024-09-20 MED ORDER — VERAPAMIL HCL 2.5 MG/ML IV SOLN
INTRAVENOUS | Status: DC | PRN
  Administered 2024-09-20: 10 mL via INTRA_ARTERIAL

## 2024-09-20 MED ORDER — HYDRALAZINE HCL 20 MG/ML IJ SOLN
INTRAMUSCULAR | Status: AC
  Filled 2024-09-20: qty 1

## 2024-09-20 MED ORDER — ATENOLOL 50 MG PO TABS
25.0000 mg | ORAL_TABLET | Freq: Once | ORAL | Status: DC
  Filled 2024-09-20: qty 1

## 2024-09-20 MED ORDER — SODIUM CHLORIDE 0.9 % IV SOLN: 250.0000 mL | INTRAVENOUS | Status: DC | PRN

## 2024-09-20 MED ORDER — HYDRALAZINE HCL 20 MG/ML IJ SOLN
INTRAMUSCULAR | Status: DC | PRN
  Administered 2024-09-20: 10 mg via INTRAVENOUS

## 2024-09-20 MED ORDER — LISINOPRIL 10 MG PO TABS
40.0000 mg | ORAL_TABLET | Freq: Once | ORAL | Status: AC
  Administered 2024-09-20: 40 mg via ORAL
  Filled 2024-09-20: qty 4

## 2024-09-20 MED ORDER — ACETAMINOPHEN 325 MG PO TABS
650.0000 mg | ORAL_TABLET | ORAL | Status: DC | PRN
  Administered 2024-09-20: 650 mg via ORAL
  Filled 2024-09-20: qty 2

## 2024-09-20 MED ORDER — NITROGLYCERIN 1 MG/10 ML FOR IR/CATH LAB
INTRA_ARTERIAL | Status: DC | PRN
  Administered 2024-09-20: 200 ug via INTRACORONARY

## 2024-09-20 MED ORDER — HEPARIN (PORCINE) IN NACL 1000-0.9 UT/500ML-% IV SOLN
INTRAVENOUS | Status: DC | PRN
  Administered 2024-09-20 (×2): 500 mL

## 2024-09-20 MED ORDER — ONDANSETRON HCL 4 MG/2ML IJ SOLN: 4.0000 mg | Freq: Four times a day (QID) | INTRAMUSCULAR | Status: DC | PRN

## 2024-09-20 SURGICAL SUPPLY — 14 items
CATH 5FR JL3.5 JR4 ANG PIG MP (CATHETERS) IMPLANT
CATH IV OPTICROSS HD 3FRX135 (CATHETERS) IMPLANT
CATH LAUNCHER 6FR EBU3.5 (CATHETERS) IMPLANT
DEVICE RAD COMP TR BAND LRG (VASCULAR PRODUCTS) IMPLANT
DRAPE IVUS SLED (BAG) IMPLANT
GLIDESHEATH SLEND SS 6F .021 (SHEATH) IMPLANT
GUIDEWIRE INQWIRE 1.5J.035X260 (WIRE) IMPLANT
KIT HEMO VALVE WATCHDOG (MISCELLANEOUS) IMPLANT
KIT SINGLE USE MANIFOLD (KITS) IMPLANT
PACK CARDIAC CATHETERIZATION (CUSTOM PROCEDURE TRAY) ×1 IMPLANT
SET ATX-X65L (MISCELLANEOUS) IMPLANT
SHEATH PROBE COVER 6X72 (BAG) IMPLANT
TUBING CIL FLEX 10 FLL-RA (TUBING) IMPLANT
WIRE ASAHI PROWATER 180CM (WIRE) IMPLANT

## 2024-09-20 NOTE — Discharge Instructions (Signed)

## 2024-09-20 NOTE — Interval H&P Note (Signed)
 History and Physical Interval Note:  09/20/2024 8:58 AM  Kelly Snow  has presented today for surgery, with the diagnosis of abnormal ct.  The various methods of treatment have been discussed with the patient and family. After consideration of risks, benefits and other options for treatment, the patient has consented to  Procedure(s): LEFT HEART CATH AND CORONARY ANGIOGRAPHY (N/A) as a surgical intervention.  The patient's history has been reviewed, patient examined, no change in status, stable for surgery.  I have reviewed the patient's chart and labs.  Questions were answered to the patient's satisfaction.   Cath Lab Visit (complete for each Cath Lab visit)  Clinical Evaluation Leading to the Procedure:   ACS: No.  Non-ACS:    Anginal Classification: No Symptoms  Anti-ischemic medical therapy: Minimal Therapy (1 class of medications)  Non-Invasive Test Results: No non-invasive testing performed  Prior CABG: No previous CABG        [name] 09/20/2024 8:58 AM

## 2024-09-20 NOTE — Progress Notes (Signed)
 TR band removed, gauze dressing applied. Right radial level 0, clean, dry, and intact.

## 2024-09-21 ENCOUNTER — Encounter (HOSPITAL_COMMUNITY): Payer: Self-pay | Admitting: Cardiology

## 2024-09-24 LAB — POCT ACTIVATED CLOTTING TIME: Activated Clotting Time: 377 s

## 2024-10-04 ENCOUNTER — Ambulatory Visit: Payer: PRIVATE HEALTH INSURANCE | Attending: Cardiology | Admitting: Cardiology

## 2024-10-04 ENCOUNTER — Encounter: Payer: Self-pay | Admitting: Cardiology

## 2024-10-04 VITALS — BP 154/86 | HR 60 | Ht 65.0 in | Wt 240.2 lb

## 2024-10-04 DIAGNOSIS — I2510 Atherosclerotic heart disease of native coronary artery without angina pectoris: Secondary | ICD-10-CM

## 2024-10-04 DIAGNOSIS — I10 Essential (primary) hypertension: Secondary | ICD-10-CM

## 2024-10-04 DIAGNOSIS — E782 Mixed hyperlipidemia: Secondary | ICD-10-CM

## 2024-10-04 NOTE — Patient Instructions (Signed)

## 2024-10-04 NOTE — Progress Notes (Signed)
 Cardiology Office Note:    Date:  10/04/2024   ID:  Kelly Snow, DOB 08/21/1961, MRN [id number]  PCP:  Waddell Rake, MD  Cardiologist:  Jennifer JONELLE Crape, MD   Referring MD: Waddell Rake, MD    ASSESSMENT:    1. Coronary artery disease involving native coronary artery of native heart without angina pectoris   2. Essential hypertension   3. Morbid obesity (HCC)   4. Mixed hyperlipidemia    PLAN:    In order of problems listed above:  Coronary artery disease: Secondary prevention stressed with the patient.  Importance of compliance with diet medication stressed and patient verbalized standing. She is walking about a mile and a half on a daily basis without any issues and I congratulated her about this. Essential hypertension: She mentions to me that she has an element of whitecoat hypertension.  She mentioned blood pressure readings at home are fine. Mixed dyslipidemia: On lipid-lowering medications.  Lipids noted and they are not at goal she will have blood work done in February by primary care and send me a copy. Morbid obesity: Weight reduction stressed diet emphasized risks of obesity explained and she promises to do better. Patient will be seen in follow-up appointment in 6 months or earlier if the patient has any concerns.   Medication Adjustments/Labs and Tests Ordered: Current medicines are reviewed at length with the patient today.  Concerns regarding medicines are outlined above.  No orders of the defined types were placed in this encounter.  No orders of the defined types were placed in this encounter.    No chief complaint on file.    History of Present Illness:    Kelly Snow is a 63 y.o. female.  Patient has recently diagnosed coronary artery disease nonobstructive in nature, essential hypertension, mixed dyslipidemia and morbid obesity.  She mentions to me that she walks a mile and a half on a regular basis.  No chest pain orthopnea or PND.  She has  an element of whitecoat hypertension.  Past Medical History:  Diagnosis Date   Cardiac murmur 06/05/2024   Coronary artery disease 06/05/2024   DOE (dyspnea on exertion) 06/05/2024   Essential hypertension 06/05/2024   Hyperlipidemia    Morbid obesity (HCC) 06/05/2024    Past Surgical History:  Procedure Laterality Date   CARPAL TUNNEL RELEASE     CORONARY ULTRASOUND/IVUS N/A 09/20/2024   Procedure: Coronary Ultrasound/IVUS;  Surgeon: Jordan, Peter M, MD;  Location: [facility] INVASIVE CV LAB;  Service: Cardiovascular;  Laterality: N/A;   LEFT HEART CATH AND CORONARY ANGIOGRAPHY N/A 09/20/2024   Procedure: LEFT HEART CATH AND CORONARY ANGIOGRAPHY;  Surgeon: Jordan, Peter M, MD;  Location: [facility] INVASIVE CV LAB;  Service: Cardiovascular;  Laterality: N/A;   REPLACEMENT TOTAL KNEE Bilateral    TOTAL HIP ARTHROPLASTY Right     Current Medications: Active Medications[1]   Allergies:   Patient has no known allergies.   Social History   Socioeconomic History   Marital status: Widowed    Spouse name: Not on file   Number of children: Not on file   Years of education: Not on file   Highest education level: Not on file  Occupational History   Not on file  Tobacco Use   Smoking status: Never   Smokeless tobacco: Never  Substance and Sexual Activity   Alcohol use: Not on file   Drug use: Not on file   Sexual activity: Not on file  Other Topics Concern  Not on file  Social History Narrative   Not on file   Social Drivers of Health   Tobacco Use: Low Risk (10/04/2024)   Patient History    Smoking Tobacco Use: Never    Smokeless Tobacco Use: Never    Passive Exposure: Not on file  Financial Resource Strain: Not on file  Food Insecurity: Not on file  Transportation Needs: Not on file  Physical Activity: Not on file  Stress: Not on file  Social Connections: Not on file  Depression (EYV7-0): Not on file  Alcohol Screen: Not on file  Housing: Not on file  Utilities: Not on file   Health Literacy: Not on file     Family History: The patient's family history includes Cancer in her mother; Diabetes in her father; Heart failure in her father; Hyperlipidemia in her father; Hypertension in her father.  ROS:   Please see the history of present illness.    All other systems reviewed and are negative.  EKGs/Labs/Other Studies Reviewed:    The following studies were reviewed today: Coronary angiography report with details discussed with the patient   Recent Labs: 07/19/2024: ALT 11 09/11/2024: BUN 11; Creatinine, Ser 0.65; Hemoglobin 13.0; Platelets 237; Potassium 4.0; Sodium 146  Recent Lipid Panel    Component Value Date/Time   CHOL 139 07/19/2024 0843   TRIG 148 07/19/2024 0843   HDL 31 (L) 07/19/2024 0843   CHOLHDL 4.5 (H) 07/19/2024 0843   LDLCALC 82 07/19/2024 0843    Physical Exam:    VS:  BP (!) 172/92   Pulse 60   Ht 5' 5 (1.651 m)   Wt 240 lb 3.2 oz (109 kg)   SpO2 99%   BMI 39.97 kg/m     Wt Readings from Last 3 Encounters:  10/04/24 240 lb 3.2 oz (109 kg)  09/20/24 244 lb (110.7 kg)  08/28/24 249 lb 6.4 oz (113.1 kg)     GEN: Patient is in no acute distress HEENT: Normal NECK: No JVD; No carotid bruits LYMPHATICS: No lymphadenopathy CARDIAC: Hear sounds regular, 2/6 systolic murmur at the apex. RESPIRATORY:  Clear to auscultation without rales, wheezing or rhonchi  ABDOMEN: Soft, non-tender, non-distended MUSCULOSKELETAL:  No edema; No deformity  SKIN: Warm and dry NEUROLOGIC:  Alert and oriented x 3 PSYCHIATRIC:  Normal affect   Signed, Jennifer JONELLE Crape, MD  10/04/2024 8:32 AM    [facility]      [1]  Current Meds  Medication Sig   aspirin  EC 81 MG tablet Take 1 tablet (81 mg total) by mouth daily. Swallow whole.   atenolol  (TENORMIN ) 25 MG tablet Take 50 mg by mouth daily.   Calcium Carb-Cholecalciferol (CALCIUM 600 + D PO) Take 2 capsules by mouth daily.   FIBER GUMMIES PO Take 3 each by  mouth daily.   lisinopril  (ZESTRIL ) 40 MG tablet Take 40 mg by mouth every morning.   MAGNESIUM PO Take 1 tablet by mouth daily.   meloxicam (MOBIC) 7.5 MG tablet Take 7.5 mg by mouth daily.   Multiple Vitamin (MULTIVITAMIN) tablet Take 1 tablet by mouth daily.   nitroGLYCERIN  (NITROSTAT ) 0.4 MG SL tablet Place 1 tablet (0.4 mg total) under the tongue every 5 (five) minutes as needed.   rosuvastatin (CRESTOR) 20 MG tablet Take 20 mg by mouth daily.   vitamin B-12 (CYANOCOBALAMIN) 500 MCG tablet Take 1,000 mcg by mouth once a week.

## 2024-11-06 ENCOUNTER — Telehealth: Payer: Self-pay | Admitting: Cardiology

## 2024-11-06 NOTE — Telephone Encounter (Signed)
 Advised that last office note and CT results have been faxed as requested. Pt verbalized understanding and had no additional questions.

## 2024-11-06 NOTE — Telephone Encounter (Signed)
 Patient wants a copy of the order for her to have her heart cath on 11/28 sent to her insurance company - [employer], phone# [phone] and fax# [phone], Attention: Claims.

## 2024-11-21 NOTE — Telephone Encounter (Signed)
 Referral letter completed and faxed as requested.

## 2024-11-21 NOTE — Telephone Encounter (Signed)
 Caller Vernia) stated she will need to get a copy of patients referral order for her 11/28 heart cath procedure sent to her at email: [email or web address] and stated she does not have access to Epic.  Caller stated can call her at [phone] or fax to [employer], phone# [phone] and fax# [phone], Attention: Claims.
